# Patient Record
Sex: Female | Born: 1950 | Race: White | Hispanic: No | Marital: Married | Smoking: Never smoker
Health system: Southern US, Community
[De-identification: ages and names within clinical notes are randomized; demographics above are authoritative.]

---

## 2006-12-12 ENCOUNTER — Emergency Department: Payer: Self-pay | Admitting: Internal Medicine

## 2012-06-01 ENCOUNTER — Ambulatory Visit: Payer: Self-pay

## 2014-01-07 ENCOUNTER — Emergency Department: Payer: Self-pay | Admitting: Emergency Medicine

## 2015-08-18 IMAGING — CR DG FOOT COMPLETE 3+V*L*
1 series · 3 of 3 positions shown · non-contrast
Comparison: None.

CLINICAL DATA: Foot pain.  Follow-up

EXAM:
LEFT FOOT - COMPLETE 3+ VIEW

[Series 1: ap · 0.17mm/px · 3 of 3 slices shown]
[im 1/3]
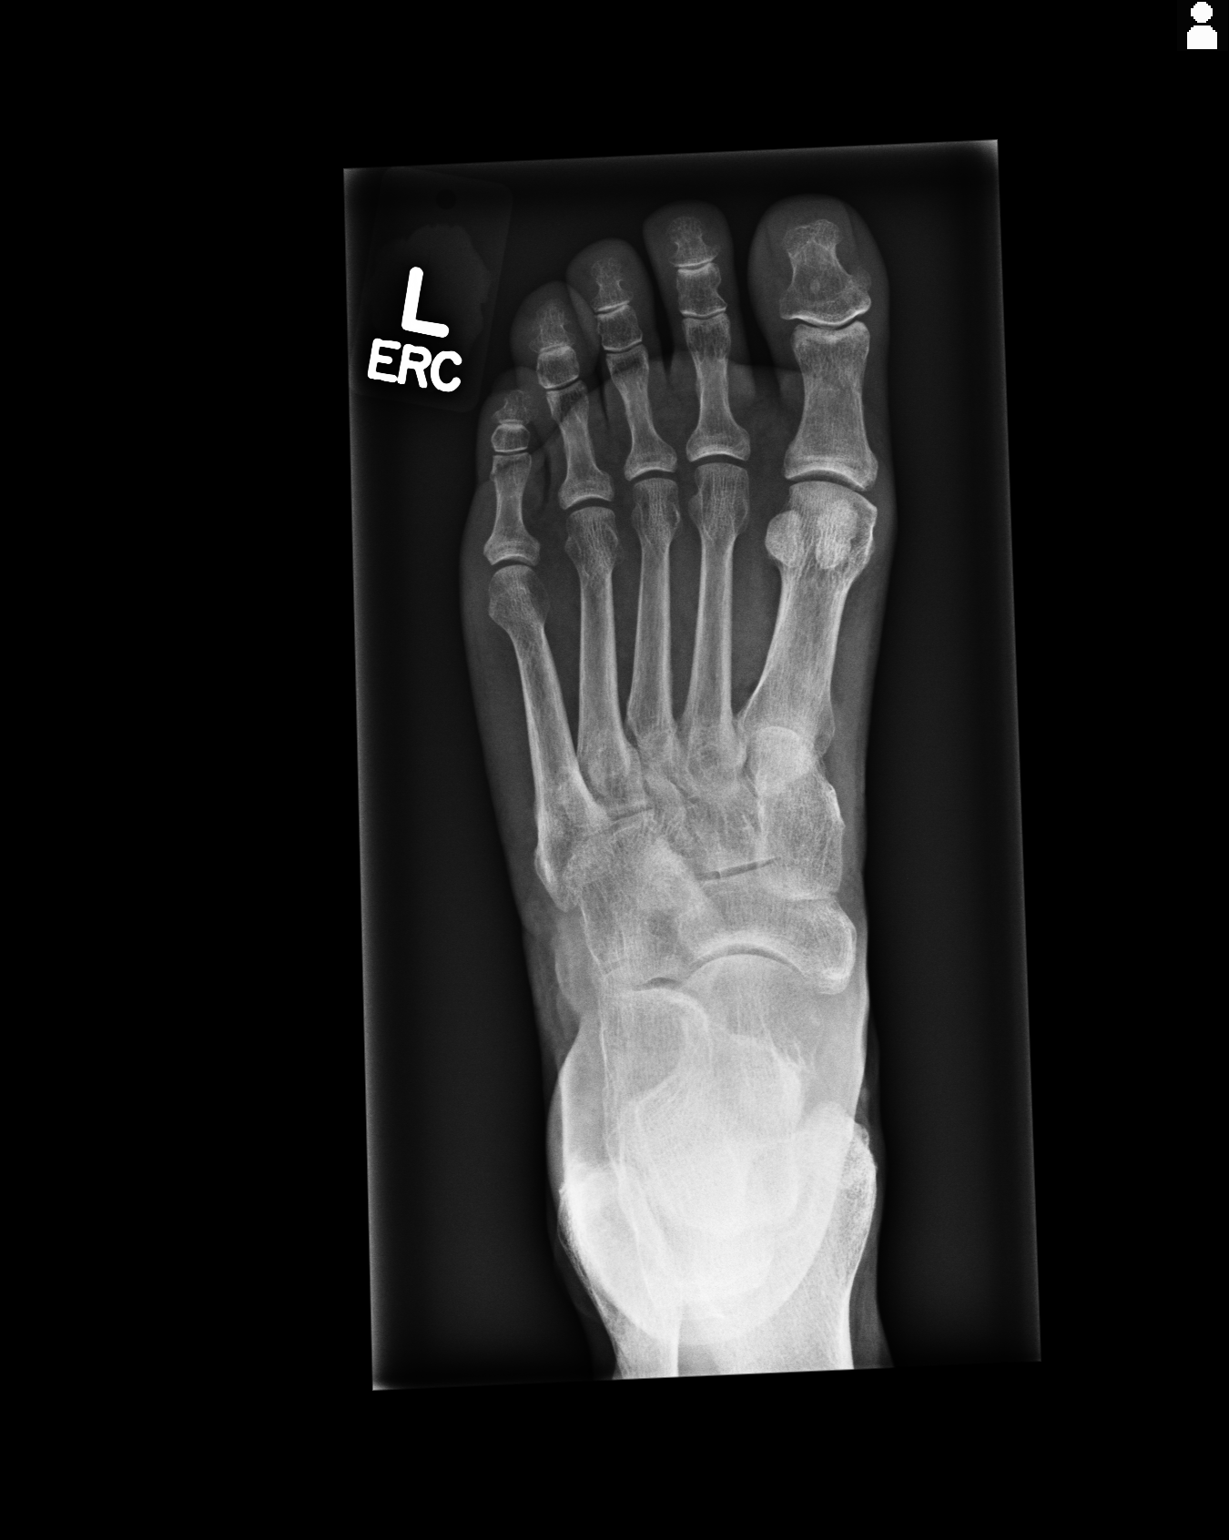
[im 2/3]
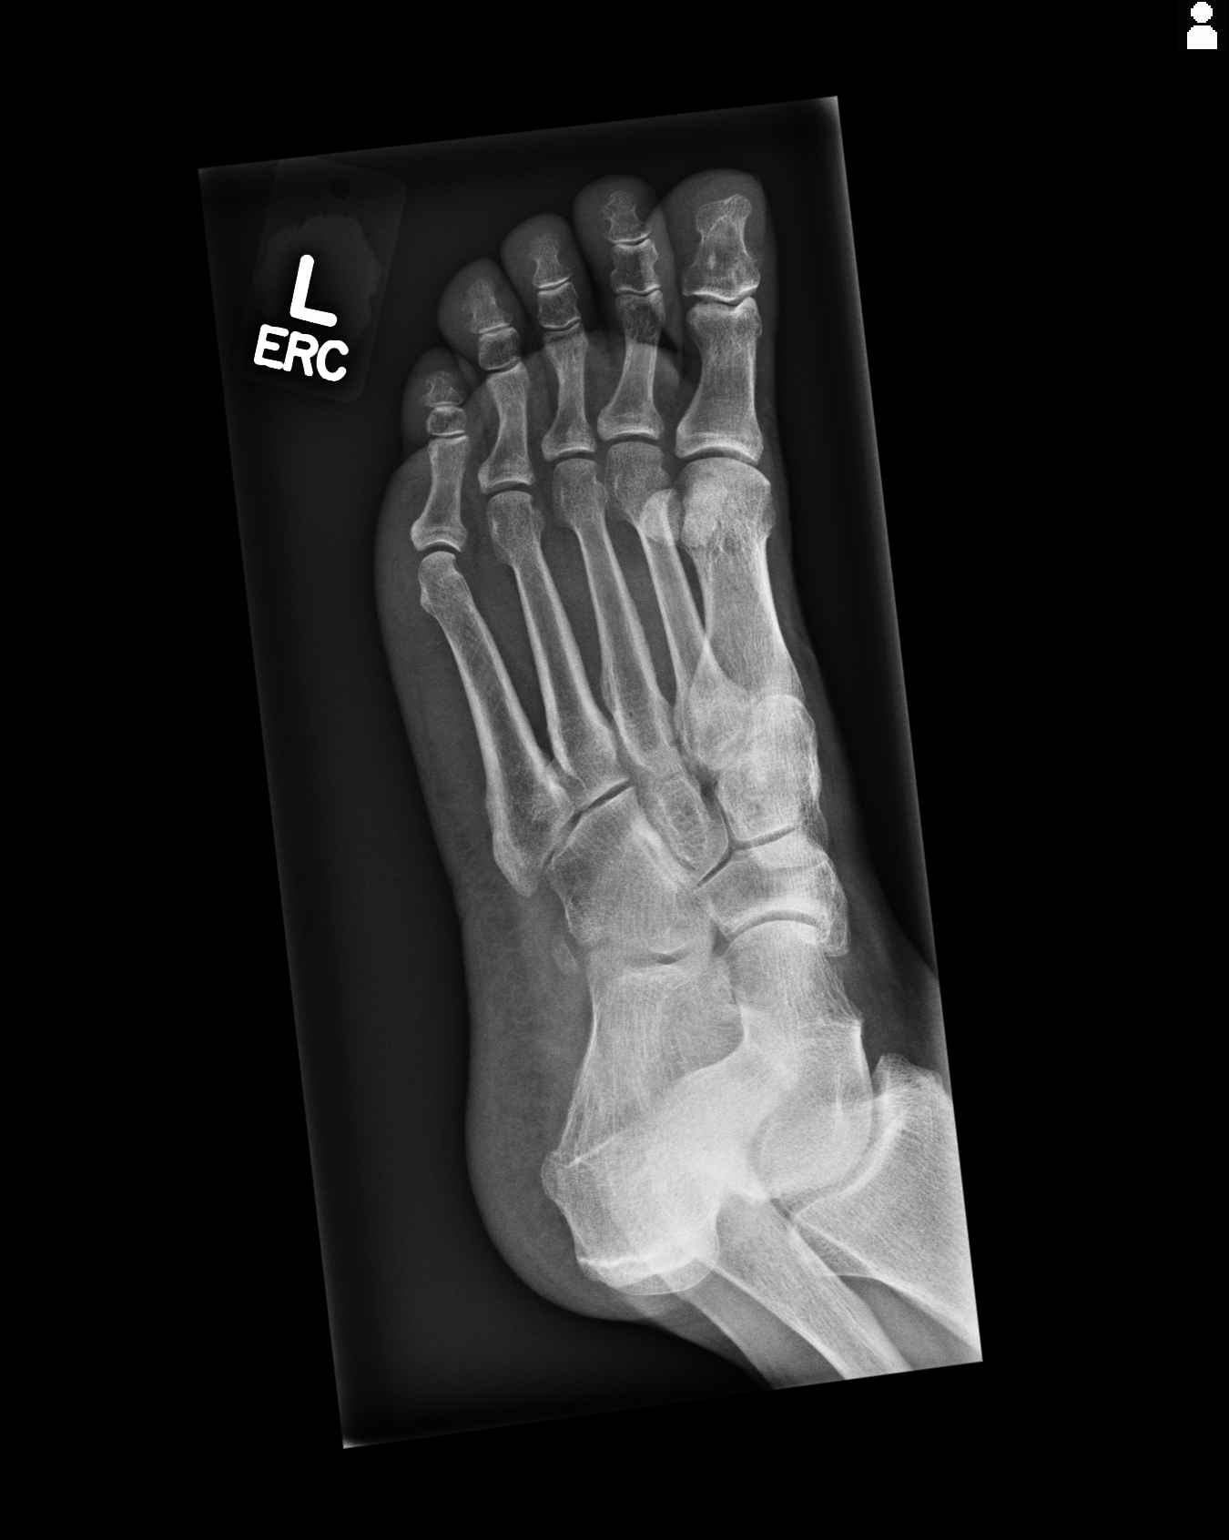
[im 3/3]
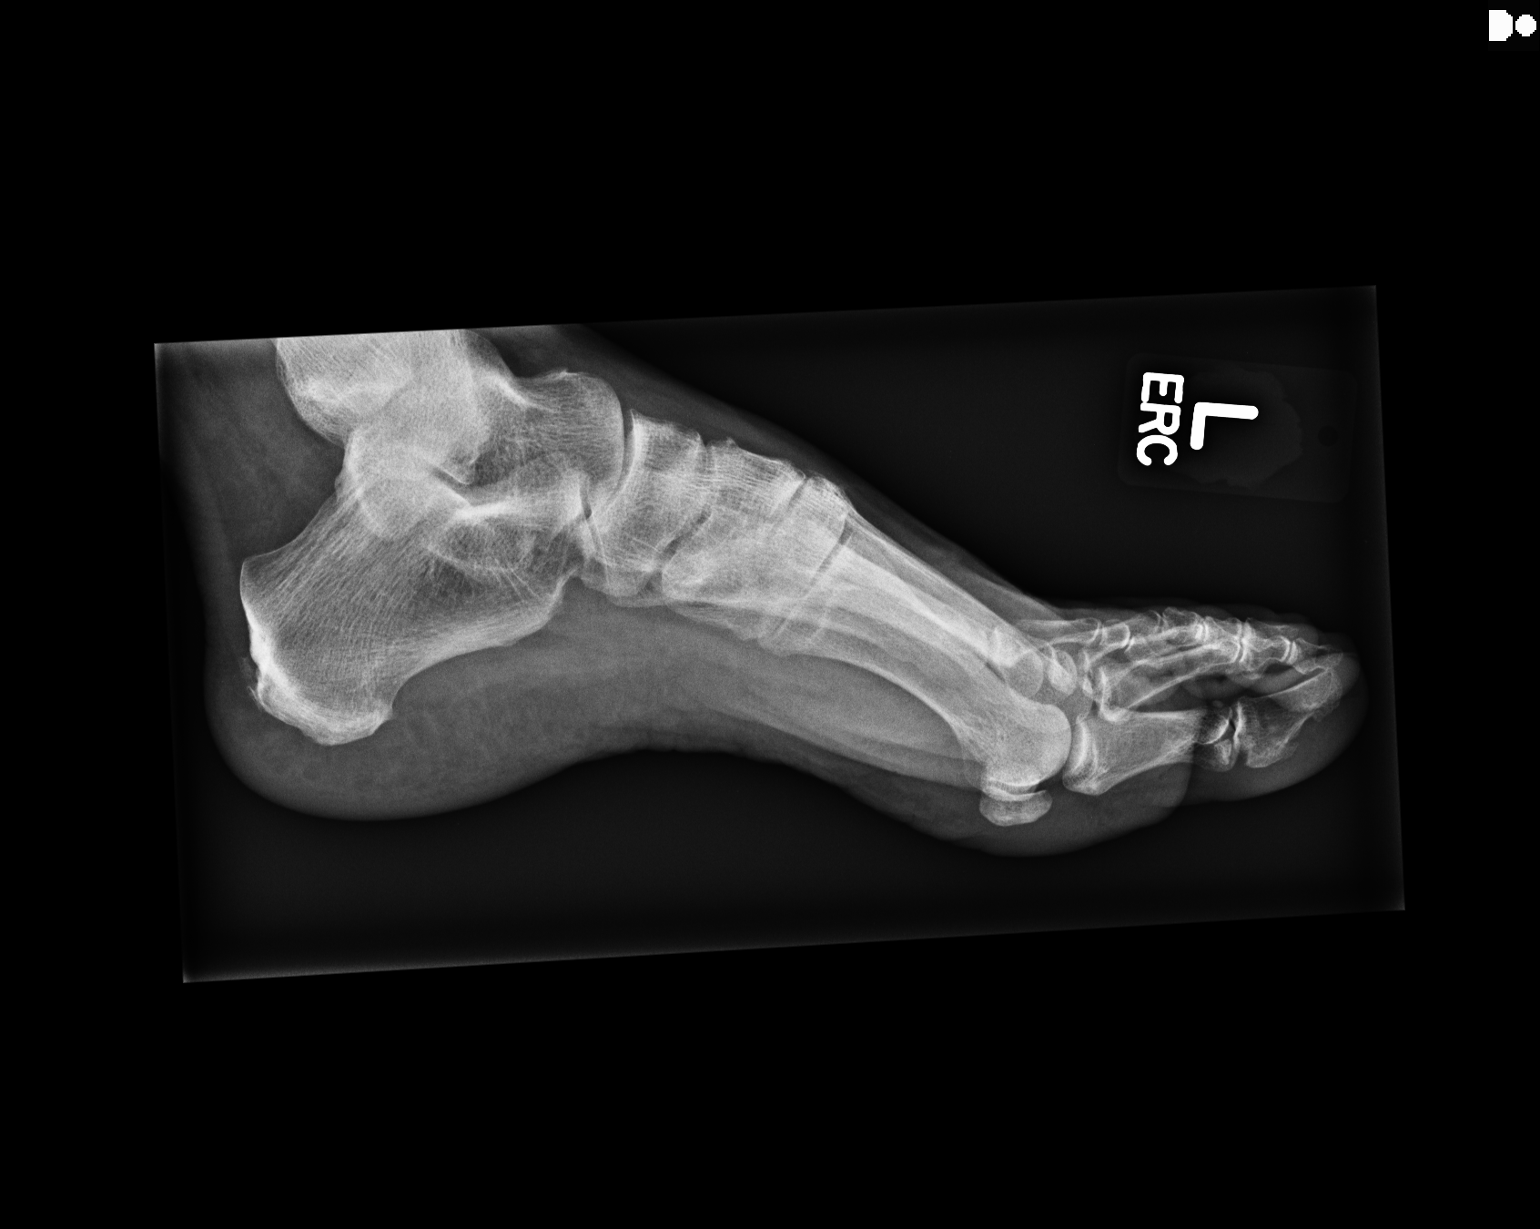

[3 of 3 positions shown; findings below may reference images not displayed]

FINDINGS: Negative for fracture. Mild degenerative change in the great toe.
Mild calcaneal spurring.
IMPRESSION: Negative for fracture.

## 2020-08-06 ENCOUNTER — Ambulatory Visit: Payer: Self-pay

## 2022-03-25 ENCOUNTER — Ambulatory Visit
Admission: EM | Admit: 2022-03-25 | Discharge: 2022-03-25 | Disposition: A | Discharge status: Home / Self Care | Payer: Medicare Other | Attending: Internal Medicine | Admitting: Internal Medicine

## 2022-03-25 DIAGNOSIS — Z23 Encounter for immunization: Secondary | ICD-10-CM

## 2022-03-25 DIAGNOSIS — S61213A Laceration without foreign body of left middle finger without damage to nail, initial encounter: Secondary | ICD-10-CM

## 2022-03-25 DIAGNOSIS — S61210A Laceration without foreign body of right index finger without damage to nail, initial encounter: Secondary | ICD-10-CM

## 2022-03-25 MED ORDER — TETANUS-DIPHTH-ACELL PERTUSSIS 5-2.5-18.5 LF-MCG/0.5 IM SUSY
0.5000 mL | PREFILLED_SYRINGE | Freq: Once | INTRAMUSCULAR | Status: AC
  Administered 2022-03-25: 0.5 mL via INTRAMUSCULAR

## 2022-03-25 NOTE — Discharge Instructions (Addendum)
Tetanus injection (Tdap type) given today.

## 2022-03-25 NOTE — ED Provider Notes (Signed)
[facility]    CSN: [id number] Arrival date & time: 03/25/22  1143      History   Chief Complaint No chief complaint on file.   HPI Jenna Jennings is a 71 y.o. female.  She presents today after cutting one of her fingers yesterday while sharpening a knife; she was able to get the bleeding stopped and the site does not hurt.  No redness/swelling, good motion.  Additionally, on 5/27, she had a small injury to her finger on the other hand, that broke the skin, while putting her dog in a crate.  No pain or swelling at that site.  She was advised by her PCP to come have her tetanus updated.  HPI  History reviewed. No pertinent past medical history.  There are no problems to display for this patient.   History reviewed. No pertinent surgical history.  OB History   No obstetric history on file.      Home Medications    Prior to Admission medications   Medication Sig Start Date End Date Taking? Authorizing Provider  empagliflozin (JARDIANCE) 10 MG TABS tablet Take 10 mg by mouth daily.   Yes [provider]    Family History History reviewed. No pertinent family history.  Social History Social History   Tobacco Use   Smoking status: Never   Smokeless tobacco: Never  Vaping Use   Vaping Use: Never used  Substance Use Topics   Alcohol use: Yes   Drug use: Never     Allergies   Codeine   Review of Systems Review of Systems   Physical Exam Triage Vital Signs ED Triage Vitals  Enc Vitals Group     BP 03/25/22 1155 125/69     Pulse Rate 03/25/22 1155 63     Resp 03/25/22 1155 20     Temp 03/25/22 1155 97.9 F (36.6 C)     Temp Source 03/25/22 1155 Oral     SpO2 03/25/22 1155 94 %     Weight 03/25/22 1153 128 lb (58.1 kg)     Height 03/25/22 1153 5\' 3"  (1.6 m)     Head Circumference --      Peak Flow --      Pain Score 03/25/22 1153 0     Pain Loc --      Pain Edu? --      Excl. in GC? --    No data found.  Updated Vital  Signs BP 125/69 (BP Location: Left Arm)   Pulse 63   Temp 97.9 F (36.6 C) (Oral)   Resp 20   Ht 5\' 3"  (1.6 m)   Wt 58.1 kg   SpO2 94%   BMI 22.67 kg/m   Visual Acuity Right Eye Distance:   Left Eye Distance:   Bilateral Distance:    Right Eye Near:   Left Eye Near:    Bilateral Near:     Physical Exam Constitutional:      General: She is not in acute distress.    Appearance: She is not ill-appearing or toxic-appearing.     Comments: Good hygiene  HENT:     Head: Atraumatic.     Mouth/Throat:     Mouth: Mucous membranes are moist.  Eyes:     Conjunctiva/sclera:     Right eye: Right conjunctiva is not injected. No exudate.    Left eye: Left conjunctiva is not injected. No exudate.    Comments: Conjugate gaze observed  Cardiovascular:  Rate and Rhythm: Normal rate.  Pulmonary:     Effort: Pulmonary effort is normal. No respiratory distress.  Abdominal:     General: There is no distension.  Musculoskeletal:       Hands:     Cervical back: Neck supple.     Comments: Walked into the urgent care independently. Injuries as marked, laceration L 3rd finger, dorsal, at PIP; tiny abrasion R 2nd finger, between PIP and DIP.  Good motion in the fingers, no focal swelling or bright erythema. No tenderness.  Skin:    General: Skin is warm and dry.     Comments: No cyanosis  Neurological:     Mental Status: She is alert.     Comments: Face symmetric, speech clear/coherent/logical     UC Treatments / Results  Labs (all labs ordered are listed, but only abnormal results are displayed) Labs Reviewed - No data to display  EKG   Radiology No results found.  Procedures Procedures (including critical care time)  Medications Ordered in UC Medications  Tdap (BOOSTRIX) injection 0.5 mL (has no administration in time range)    Initial Impression / Assessment and Plan / UC Course  I have reviewed the triage vital signs and the nursing notes.  Pertinent labs &  imaging results that were available during my care of the patient were reviewed by me and considered in my medical decision making (see chart for details).     *** Final Clinical Impressions(s) / UC Diagnoses   Final diagnoses:  Laceration of left middle finger without foreign body without damage to nail, initial encounter  Laceration of right index finger without foreign body without damage to nail, initial encounter     Discharge Instructions      Tetanus injection (Tdap type) given today.    ED Prescriptions   None    PDMP not reviewed this encounter.

## 2022-03-25 NOTE — ED Triage Notes (Signed)
Patient presents to UC today for a Laceration on her left hand middle finger with a knife -- 03/24/2022 Right hand index finger -- on a dog cage - 03/22/2022  She was told by her PCP to make sure to comes be seen if she cuts herself due to not having an up to date tetanus.
